# Patient Record
Sex: Male | Born: 1960 | Race: White | Hispanic: No | Marital: Married | Smoking: Former smoker
Health system: Southern US, Community
[De-identification: ages and names within clinical notes are randomized; demographics above are authoritative.]

## PROBLEM LIST (undated history)

## (undated) HISTORY — PX: APPENDECTOMY: SHX54

---

## 2019-02-09 DIAGNOSIS — Z13220 Encounter for screening for lipoid disorders: Secondary | ICD-10-CM | POA: Diagnosis not present

## 2019-02-09 DIAGNOSIS — Z7689 Persons encountering health services in other specified circumstances: Secondary | ICD-10-CM | POA: Diagnosis not present

## 2019-02-09 DIAGNOSIS — M79605 Pain in left leg: Secondary | ICD-10-CM | POA: Diagnosis not present

## 2019-02-09 DIAGNOSIS — Z0000 Encounter for general adult medical examination without abnormal findings: Secondary | ICD-10-CM | POA: Diagnosis not present

## 2019-02-09 DIAGNOSIS — Z125 Encounter for screening for malignant neoplasm of prostate: Secondary | ICD-10-CM | POA: Diagnosis not present

## 2019-02-09 DIAGNOSIS — F5101 Primary insomnia: Secondary | ICD-10-CM | POA: Diagnosis not present

## 2019-05-10 DIAGNOSIS — F5101 Primary insomnia: Secondary | ICD-10-CM | POA: Diagnosis not present

## 2019-08-11 DIAGNOSIS — Z23 Encounter for immunization: Secondary | ICD-10-CM | POA: Diagnosis not present

## 2019-12-01 DIAGNOSIS — U071 COVID-19: Secondary | ICD-10-CM | POA: Diagnosis not present

## 2020-05-24 ENCOUNTER — Other Ambulatory Visit: Payer: Self-pay

## 2020-05-24 ENCOUNTER — Ambulatory Visit (INDEPENDENT_AMBULATORY_CARE_PROVIDER_SITE_OTHER): Payer: BC Managed Care – PPO | Admitting: Sports Medicine

## 2020-05-24 ENCOUNTER — Ambulatory Visit (INDEPENDENT_AMBULATORY_CARE_PROVIDER_SITE_OTHER): Payer: BC Managed Care – PPO

## 2020-05-24 ENCOUNTER — Encounter: Payer: Self-pay | Admitting: Sports Medicine

## 2020-05-24 DIAGNOSIS — M1611 Unilateral primary osteoarthritis, right hip: Secondary | ICD-10-CM

## 2020-05-24 DIAGNOSIS — M5416 Radiculopathy, lumbar region: Secondary | ICD-10-CM | POA: Diagnosis not present

## 2020-05-24 DIAGNOSIS — M545 Low back pain, unspecified: Secondary | ICD-10-CM | POA: Diagnosis not present

## 2020-05-24 DIAGNOSIS — M79604 Pain in right leg: Secondary | ICD-10-CM | POA: Diagnosis not present

## 2020-05-24 MED ORDER — PREDNISONE 50 MG PO TABS: ORAL_TABLET | ORAL | 0 refills | Status: AC

## 2020-05-24 NOTE — Assessment & Plan Note (Signed)
Getting some x-rays of the hip, prednisone should be helpful, ibuprofen has unfortunately not been efficacious in the past. If insufficient improvement we will inject his hip joint.

## 2020-05-24 NOTE — Assessment & Plan Note (Signed)
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 On exam he does have a positive straight leg raise on the right. No red flag symptoms. We discussed the evolutionary anthropology of low back pain, adding x-rays, referral to Dr. Allison Quarry for chiropractic manipulation as he has had good relief for chiropractors in the past. 5 days of prednisone. Return to see me in 6 weeks, MRI for interventional planning if no better.

## 2020-05-24 NOTE — Progress Notes (Signed)
    Procedures performed today:    None.  Independent interpretation of notes and tests performed by another provider:   None.  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 On exam he does have a positive straight leg raise on the right. No red flag symptoms. We discussed the evolutionary anthropology of low back pain, adding x-rays, referral to Dr. Allison Quarry for chiropractic manipulation as he has had good relief for chiropractors in the past. 5 days of prednisone. Return to see me in 6 weeks, MRI for interventional planning if no better.  Primary osteoarthritis of right hip Getting some x-rays of the hip, prednisone should be helpful, ibuprofen has unfortunately not been efficacious in the past. If insufficient improvement we will inject his hip joint.    ___________________________________________ Ihor Austin. Benjamin Stain, M.D., ABFM., CAQSM. Primary Care and Sports Medicine [facility]  Adjunct Instructor of Family Medicine  [employer]

## 2020-05-29 DIAGNOSIS — M5117 Intervertebral disc disorders with radiculopathy, lumbosacral region: Secondary | ICD-10-CM | POA: Diagnosis not present

## 2020-05-29 DIAGNOSIS — M9905 Segmental and somatic dysfunction of pelvic region: Secondary | ICD-10-CM | POA: Diagnosis not present

## 2020-05-29 DIAGNOSIS — M9903 Segmental and somatic dysfunction of lumbar region: Secondary | ICD-10-CM | POA: Diagnosis not present

## 2020-05-29 DIAGNOSIS — M9904 Segmental and somatic dysfunction of sacral region: Secondary | ICD-10-CM | POA: Diagnosis not present

## 2020-05-31 DIAGNOSIS — M5117 Intervertebral disc disorders with radiculopathy, lumbosacral region: Secondary | ICD-10-CM | POA: Diagnosis not present

## 2020-05-31 DIAGNOSIS — M9903 Segmental and somatic dysfunction of lumbar region: Secondary | ICD-10-CM | POA: Diagnosis not present

## 2020-05-31 DIAGNOSIS — M9904 Segmental and somatic dysfunction of sacral region: Secondary | ICD-10-CM | POA: Diagnosis not present

## 2020-05-31 DIAGNOSIS — M9905 Segmental and somatic dysfunction of pelvic region: Secondary | ICD-10-CM | POA: Diagnosis not present

## 2020-06-02 DIAGNOSIS — M5117 Intervertebral disc disorders with radiculopathy, lumbosacral region: Secondary | ICD-10-CM | POA: Diagnosis not present

## 2020-06-02 DIAGNOSIS — M9904 Segmental and somatic dysfunction of sacral region: Secondary | ICD-10-CM | POA: Diagnosis not present

## 2020-06-02 DIAGNOSIS — M9903 Segmental and somatic dysfunction of lumbar region: Secondary | ICD-10-CM | POA: Diagnosis not present

## 2020-06-02 DIAGNOSIS — M9905 Segmental and somatic dysfunction of pelvic region: Secondary | ICD-10-CM | POA: Diagnosis not present

## 2020-06-06 DIAGNOSIS — M9904 Segmental and somatic dysfunction of sacral region: Secondary | ICD-10-CM | POA: Diagnosis not present

## 2020-06-06 DIAGNOSIS — M5117 Intervertebral disc disorders with radiculopathy, lumbosacral region: Secondary | ICD-10-CM | POA: Diagnosis not present

## 2020-06-06 DIAGNOSIS — M9903 Segmental and somatic dysfunction of lumbar region: Secondary | ICD-10-CM | POA: Diagnosis not present

## 2020-06-06 DIAGNOSIS — M9905 Segmental and somatic dysfunction of pelvic region: Secondary | ICD-10-CM | POA: Diagnosis not present

## 2020-06-08 DIAGNOSIS — M5117 Intervertebral disc disorders with radiculopathy, lumbosacral region: Secondary | ICD-10-CM | POA: Diagnosis not present

## 2020-06-08 DIAGNOSIS — M9905 Segmental and somatic dysfunction of pelvic region: Secondary | ICD-10-CM | POA: Diagnosis not present

## 2020-06-08 DIAGNOSIS — M9904 Segmental and somatic dysfunction of sacral region: Secondary | ICD-10-CM | POA: Diagnosis not present

## 2020-06-08 DIAGNOSIS — M9903 Segmental and somatic dysfunction of lumbar region: Secondary | ICD-10-CM | POA: Diagnosis not present

## 2020-06-12 DIAGNOSIS — M9905 Segmental and somatic dysfunction of pelvic region: Secondary | ICD-10-CM | POA: Diagnosis not present

## 2020-06-12 DIAGNOSIS — M5117 Intervertebral disc disorders with radiculopathy, lumbosacral region: Secondary | ICD-10-CM | POA: Diagnosis not present

## 2020-06-12 DIAGNOSIS — M9903 Segmental and somatic dysfunction of lumbar region: Secondary | ICD-10-CM | POA: Diagnosis not present

## 2020-06-12 DIAGNOSIS — M9904 Segmental and somatic dysfunction of sacral region: Secondary | ICD-10-CM | POA: Diagnosis not present

## 2020-06-14 DIAGNOSIS — M5117 Intervertebral disc disorders with radiculopathy, lumbosacral region: Secondary | ICD-10-CM | POA: Diagnosis not present

## 2020-06-14 DIAGNOSIS — M9904 Segmental and somatic dysfunction of sacral region: Secondary | ICD-10-CM | POA: Diagnosis not present

## 2020-06-14 DIAGNOSIS — M9903 Segmental and somatic dysfunction of lumbar region: Secondary | ICD-10-CM | POA: Diagnosis not present

## 2020-06-14 DIAGNOSIS — M9905 Segmental and somatic dysfunction of pelvic region: Secondary | ICD-10-CM | POA: Diagnosis not present

## 2020-06-20 DIAGNOSIS — M9904 Segmental and somatic dysfunction of sacral region: Secondary | ICD-10-CM | POA: Diagnosis not present

## 2020-06-20 DIAGNOSIS — M5117 Intervertebral disc disorders with radiculopathy, lumbosacral region: Secondary | ICD-10-CM | POA: Diagnosis not present

## 2020-06-20 DIAGNOSIS — M9905 Segmental and somatic dysfunction of pelvic region: Secondary | ICD-10-CM | POA: Diagnosis not present

## 2020-06-20 DIAGNOSIS — M9903 Segmental and somatic dysfunction of lumbar region: Secondary | ICD-10-CM | POA: Diagnosis not present

## 2020-06-22 DIAGNOSIS — M5117 Intervertebral disc disorders with radiculopathy, lumbosacral region: Secondary | ICD-10-CM | POA: Diagnosis not present

## 2020-06-22 DIAGNOSIS — M9904 Segmental and somatic dysfunction of sacral region: Secondary | ICD-10-CM | POA: Diagnosis not present

## 2020-06-22 DIAGNOSIS — M9905 Segmental and somatic dysfunction of pelvic region: Secondary | ICD-10-CM | POA: Diagnosis not present

## 2020-06-22 DIAGNOSIS — M9903 Segmental and somatic dysfunction of lumbar region: Secondary | ICD-10-CM | POA: Diagnosis not present

## 2020-06-26 DIAGNOSIS — M9905 Segmental and somatic dysfunction of pelvic region: Secondary | ICD-10-CM | POA: Diagnosis not present

## 2020-06-26 DIAGNOSIS — M5117 Intervertebral disc disorders with radiculopathy, lumbosacral region: Secondary | ICD-10-CM | POA: Diagnosis not present

## 2020-06-26 DIAGNOSIS — M9904 Segmental and somatic dysfunction of sacral region: Secondary | ICD-10-CM | POA: Diagnosis not present

## 2020-06-26 DIAGNOSIS — M9903 Segmental and somatic dysfunction of lumbar region: Secondary | ICD-10-CM | POA: Diagnosis not present

## 2020-06-28 DIAGNOSIS — M9905 Segmental and somatic dysfunction of pelvic region: Secondary | ICD-10-CM | POA: Diagnosis not present

## 2020-06-28 DIAGNOSIS — M9903 Segmental and somatic dysfunction of lumbar region: Secondary | ICD-10-CM | POA: Diagnosis not present

## 2020-06-28 DIAGNOSIS — M9904 Segmental and somatic dysfunction of sacral region: Secondary | ICD-10-CM | POA: Diagnosis not present

## 2020-06-28 DIAGNOSIS — M5117 Intervertebral disc disorders with radiculopathy, lumbosacral region: Secondary | ICD-10-CM | POA: Diagnosis not present

## 2020-07-04 DIAGNOSIS — M5117 Intervertebral disc disorders with radiculopathy, lumbosacral region: Secondary | ICD-10-CM | POA: Diagnosis not present

## 2020-07-04 DIAGNOSIS — M9904 Segmental and somatic dysfunction of sacral region: Secondary | ICD-10-CM | POA: Diagnosis not present

## 2020-07-04 DIAGNOSIS — M9905 Segmental and somatic dysfunction of pelvic region: Secondary | ICD-10-CM | POA: Diagnosis not present

## 2020-07-04 DIAGNOSIS — M9903 Segmental and somatic dysfunction of lumbar region: Secondary | ICD-10-CM | POA: Diagnosis not present

## 2020-07-06 DIAGNOSIS — M9903 Segmental and somatic dysfunction of lumbar region: Secondary | ICD-10-CM | POA: Diagnosis not present

## 2020-07-06 DIAGNOSIS — M9905 Segmental and somatic dysfunction of pelvic region: Secondary | ICD-10-CM | POA: Diagnosis not present

## 2020-07-06 DIAGNOSIS — M9904 Segmental and somatic dysfunction of sacral region: Secondary | ICD-10-CM | POA: Diagnosis not present

## 2020-07-06 DIAGNOSIS — M5117 Intervertebral disc disorders with radiculopathy, lumbosacral region: Secondary | ICD-10-CM | POA: Diagnosis not present

## 2020-07-11 DIAGNOSIS — M9903 Segmental and somatic dysfunction of lumbar region: Secondary | ICD-10-CM | POA: Diagnosis not present

## 2020-07-11 DIAGNOSIS — M5117 Intervertebral disc disorders with radiculopathy, lumbosacral region: Secondary | ICD-10-CM | POA: Diagnosis not present

## 2020-07-11 DIAGNOSIS — M9905 Segmental and somatic dysfunction of pelvic region: Secondary | ICD-10-CM | POA: Diagnosis not present

## 2020-07-11 DIAGNOSIS — M9904 Segmental and somatic dysfunction of sacral region: Secondary | ICD-10-CM | POA: Diagnosis not present

## 2020-07-13 DIAGNOSIS — M9905 Segmental and somatic dysfunction of pelvic region: Secondary | ICD-10-CM | POA: Diagnosis not present

## 2020-07-13 DIAGNOSIS — M9903 Segmental and somatic dysfunction of lumbar region: Secondary | ICD-10-CM | POA: Diagnosis not present

## 2020-07-13 DIAGNOSIS — M9904 Segmental and somatic dysfunction of sacral region: Secondary | ICD-10-CM | POA: Diagnosis not present

## 2020-07-13 DIAGNOSIS — M5117 Intervertebral disc disorders with radiculopathy, lumbosacral region: Secondary | ICD-10-CM | POA: Diagnosis not present

## 2020-07-18 DIAGNOSIS — M9904 Segmental and somatic dysfunction of sacral region: Secondary | ICD-10-CM | POA: Diagnosis not present

## 2020-07-18 DIAGNOSIS — M9903 Segmental and somatic dysfunction of lumbar region: Secondary | ICD-10-CM | POA: Diagnosis not present

## 2020-07-18 DIAGNOSIS — M9905 Segmental and somatic dysfunction of pelvic region: Secondary | ICD-10-CM | POA: Diagnosis not present

## 2020-07-18 DIAGNOSIS — M5117 Intervertebral disc disorders with radiculopathy, lumbosacral region: Secondary | ICD-10-CM | POA: Diagnosis not present

## 2020-07-25 ENCOUNTER — Ambulatory Visit: Payer: BC Managed Care – PPO | Admitting: Sports Medicine

## 2020-07-25 ENCOUNTER — Other Ambulatory Visit: Payer: Self-pay

## 2020-07-25 DIAGNOSIS — M1611 Unilateral primary osteoarthritis, right hip: Secondary | ICD-10-CM | POA: Diagnosis not present

## 2020-07-25 DIAGNOSIS — M5416 Radiculopathy, lumbar region: Secondary | ICD-10-CM

## 2020-07-25 NOTE — Assessment & Plan Note (Addendum)
Daniel Dean also had some right groin pain, suspected to be hip osteoarthritis on exam, we did some x-rays that did show mild hip OA, also better with the prednisone, chiropractic manipulation. 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

## 2020-07-25 NOTE — Assessment & Plan Note (Addendum)
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 Allison Quarry to do some chiropractic manipulation, we also did 5 days of prednisone, x-rays did show expected degenerative changes, he returns today completely pain-free.

## 2020-07-25 NOTE — Progress Notes (Signed)
    Procedures performed today:    None.  Independent interpretation of notes and tests performed by another provider:   None.  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 Allison Quarry to do some chiropractic manipulation, we also did 5 days of prednisone, x-rays did show expected degenerative changes, he returns today completely pain-free.  Primary osteoarthritis of right hip Herbie also had some right groin pain, suspected to be hip osteoarthritis on exam, we did some x-rays that did show mild hip OA, also better with the prednisone, chiropractic manipulation. 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    ___________________________________________ Ihor Austin. Benjamin Stain, M.D., ABFM., CAQSM. Primary Care and Sports Medicine [facility]  Adjunct Instructor of Family Medicine  [employer]

## 2023-03-03 IMAGING — DX DG LUMBAR SPINE COMPLETE 4+V
5 series · 5 of 5 positions shown · non-contrast
Comparison: None.

CLINICAL DATA: Chronic lower back pain.

EXAM:
LUMBAR SPINE - COMPLETE 4+ VIEW

[l-spine ap]
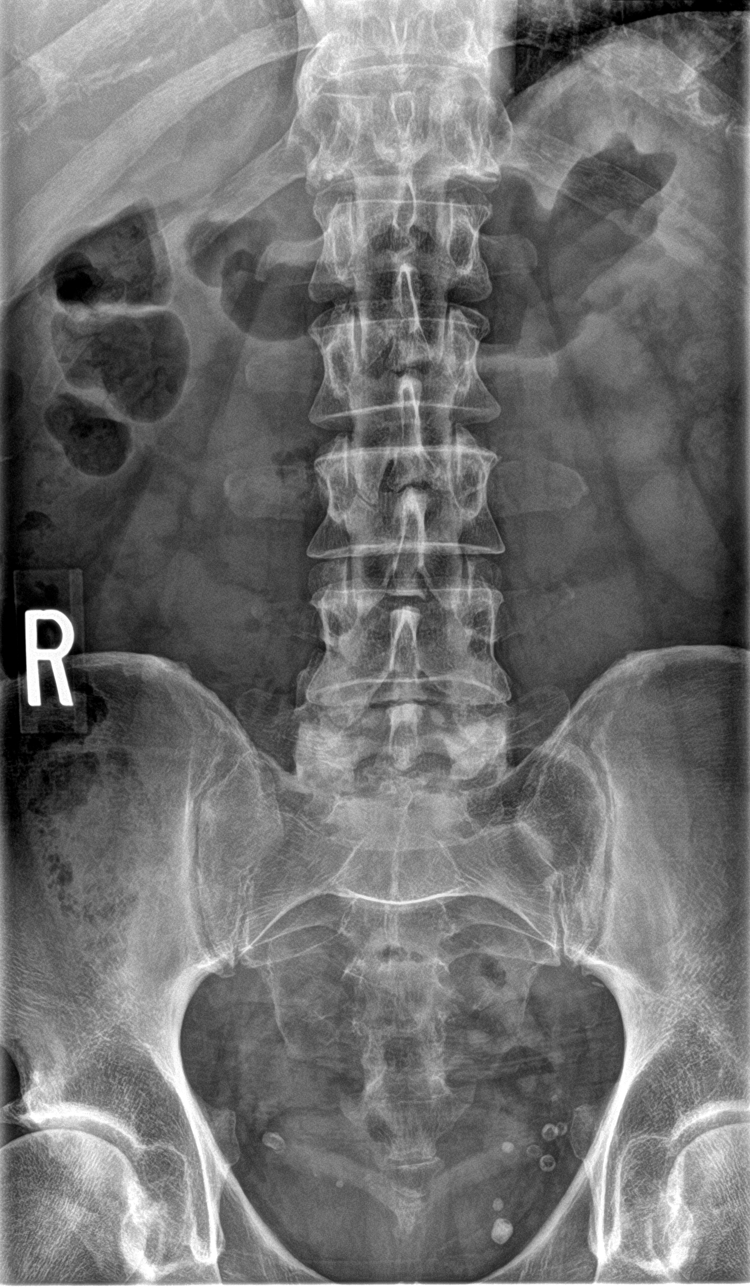

[l-spine obl (1 of 2)]
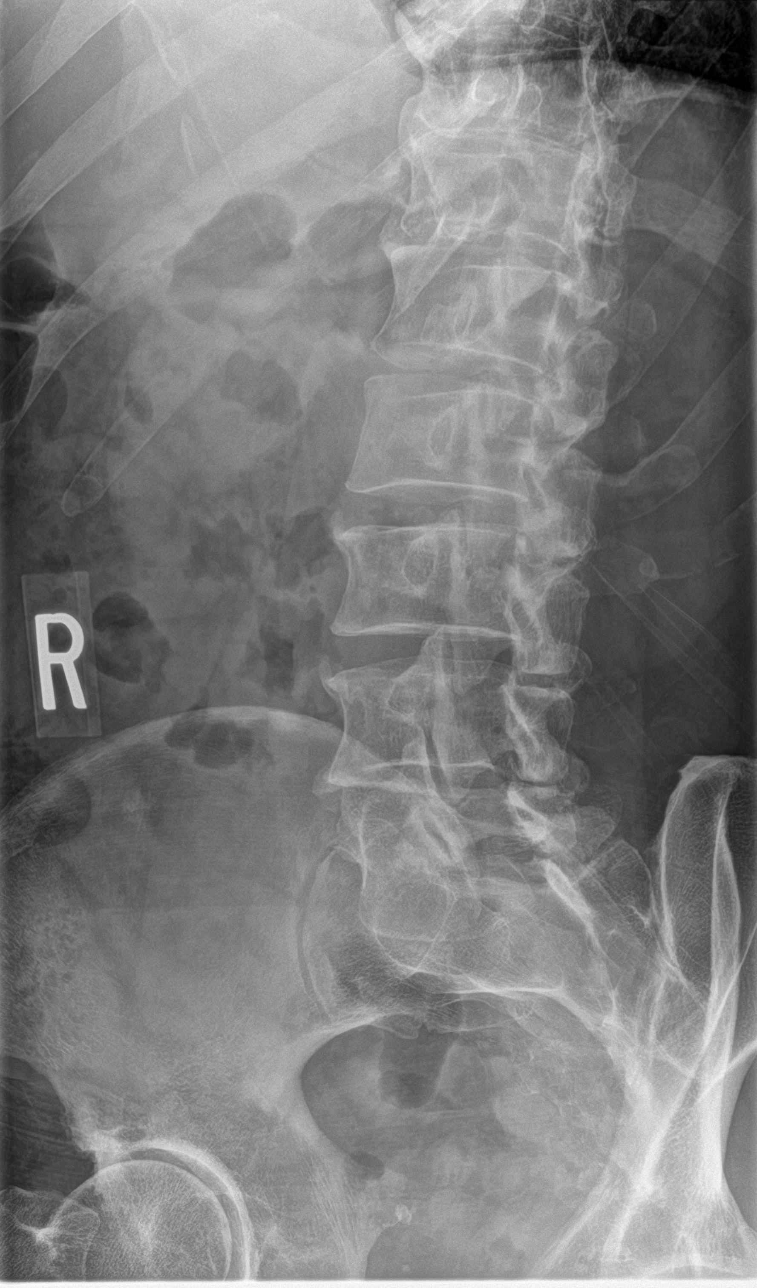

[l-spine obl (2 of 2)]
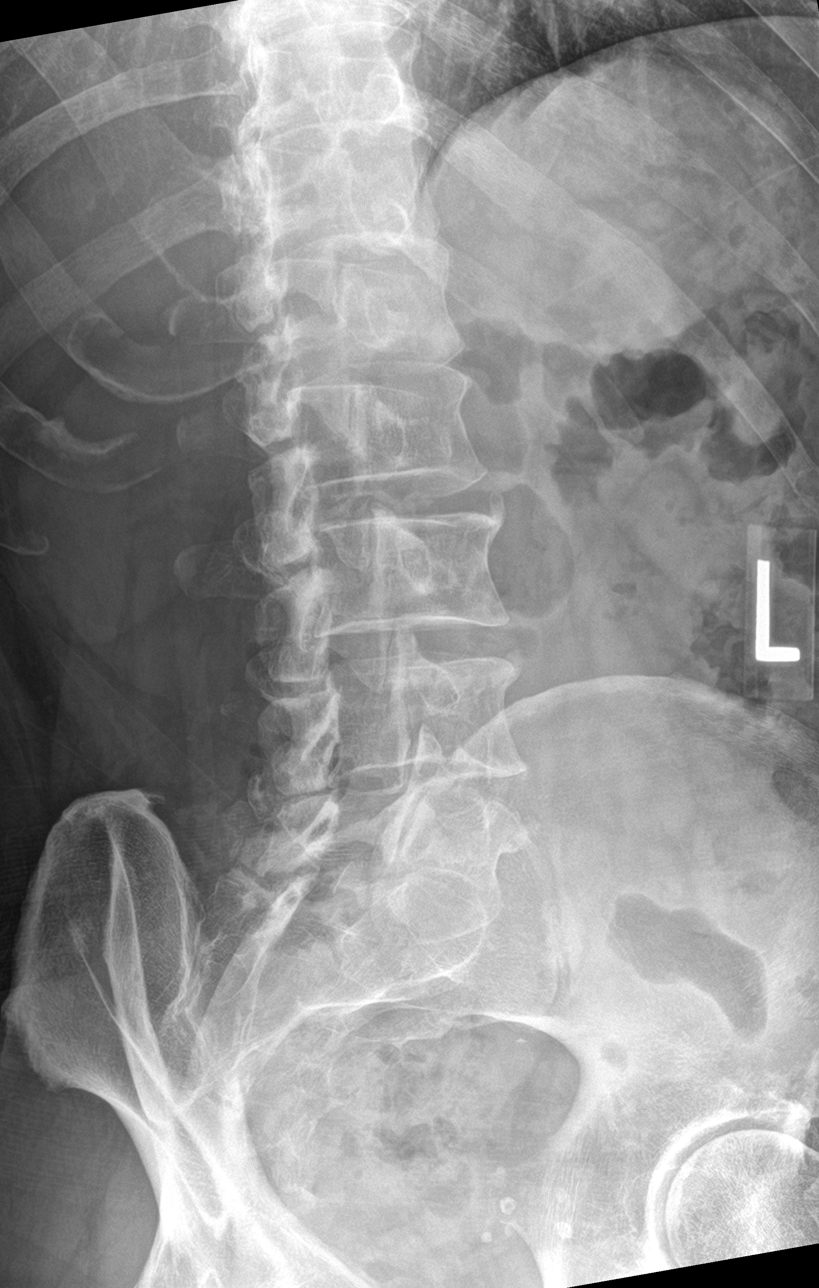

[l-spine lat (1 of 2)]
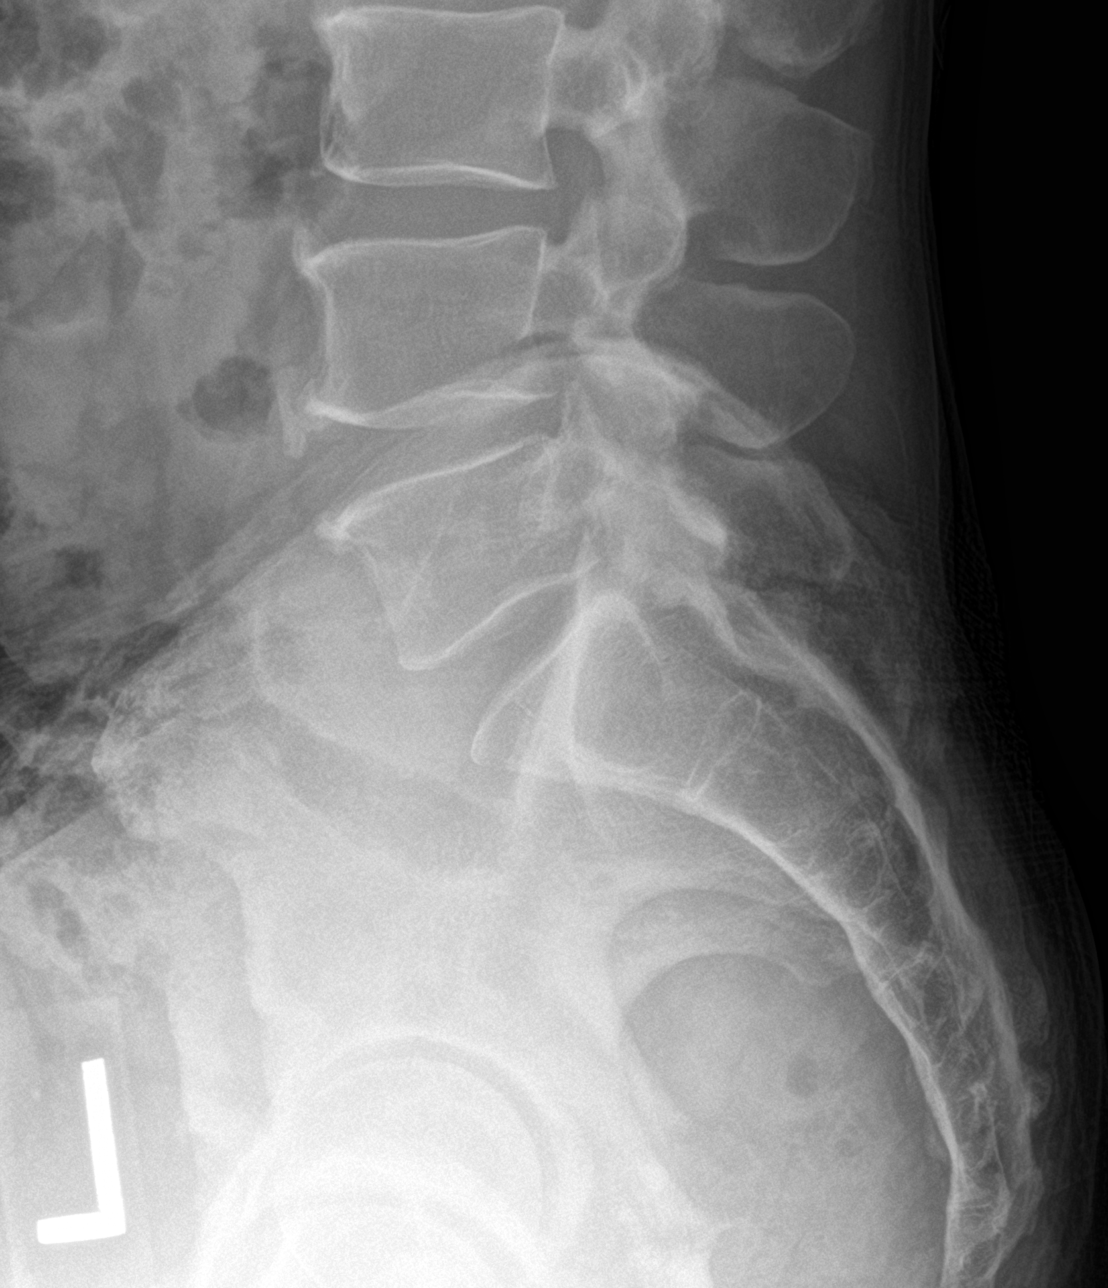

[l-spine lat (2 of 2)]
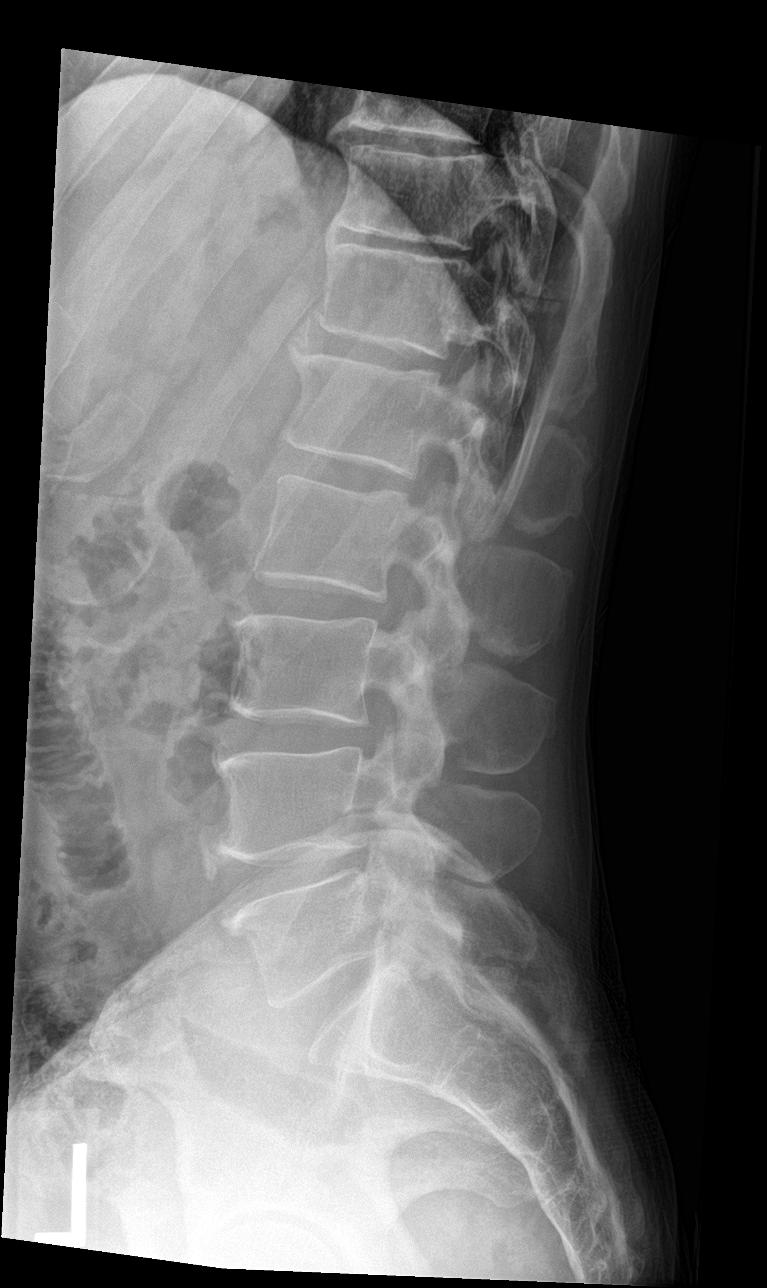

[5 of 5 positions shown; findings below may reference images not displayed]

FINDINGS: There is no evidence of lumbar spine fracture. Alignment is normal.
Intervertebral disc spaces are maintained. Mild anterior osteophyte
formation is noted at L3-4 and L4-5.
IMPRESSION: Mild multilevel degenerative changes are noted. No acute abnormality
is noted.
